# Patient Record
Sex: Female | Born: 1985 | Race: Black or African American | Hispanic: No | Marital: Single | Smoking: Never smoker
Health system: Southern US, Community
[De-identification: ages and names within clinical notes are randomized; demographics above are authoritative.]

## PROBLEM LIST (undated history)

## (undated) DIAGNOSIS — J45909 Unspecified asthma, uncomplicated: Secondary | ICD-10-CM

## (undated) HISTORY — PX: WISDOM TOOTH EXTRACTION: SHX21

## (undated) HISTORY — PX: TONSILLECTOMY: SUR1361

---

## 2012-06-30 ENCOUNTER — Emergency Department (HOSPITAL_BASED_OUTPATIENT_CLINIC_OR_DEPARTMENT_OTHER)
Admission: EM | Admit: 2012-06-30 | Discharge: 2012-06-30 | Disposition: A | Discharge status: Home / Self Care | Payer: Medicaid - Out of State | Attending: Emergency Medicine | Admitting: Emergency Medicine

## 2012-06-30 ENCOUNTER — Encounter (HOSPITAL_BASED_OUTPATIENT_CLINIC_OR_DEPARTMENT_OTHER): Payer: Self-pay | Admitting: *Deleted

## 2012-06-30 ENCOUNTER — Emergency Department (HOSPITAL_BASED_OUTPATIENT_CLINIC_OR_DEPARTMENT_OTHER): Payer: Medicaid - Out of State

## 2012-06-30 DIAGNOSIS — J3489 Other specified disorders of nose and nasal sinuses: Secondary | ICD-10-CM | POA: Insufficient documentation

## 2012-06-30 DIAGNOSIS — J189 Pneumonia, unspecified organism: Secondary | ICD-10-CM

## 2012-06-30 DIAGNOSIS — R509 Fever, unspecified: Secondary | ICD-10-CM | POA: Insufficient documentation

## 2012-06-30 DIAGNOSIS — R0789 Other chest pain: Secondary | ICD-10-CM | POA: Insufficient documentation

## 2012-06-30 MED ORDER — ACETAMINOPHEN 325 MG PO TABS
650.0000 mg | ORAL_TABLET | Freq: Once | ORAL | Status: AC
  Administered 2012-06-30: 650 mg via ORAL
  Filled 2012-06-30: qty 2

## 2012-06-30 MED ORDER — AZITHROMYCIN 250 MG PO TABS
500.0000 mg | ORAL_TABLET | Freq: Once | ORAL | Status: AC
  Administered 2012-06-30: 500 mg via ORAL
  Filled 2012-06-30: qty 2

## 2012-06-30 MED ORDER — AZITHROMYCIN 250 MG PO TABS: 250.0000 mg | ORAL_TABLET | Freq: Every day | ORAL | Status: DC

## 2012-06-30 NOTE — ED Notes (Signed)
Productive cough with yellow sputum, sore throat, and soreness in her chest. After taking Tussin she got sob and had chest tightness.

## 2012-06-30 NOTE — ED Notes (Signed)
Pt reports that she has had upper airway infection, headache, earache for several weeks, yesterday she bought robutussin dm, immediately felt tight sensation in chest and reports burning feeling, pt called insurance nurse and reported it but did not come to er at that time,  Pt came to ed tonight because she states she can not stop coughing

## 2012-06-30 NOTE — ED Provider Notes (Signed)
History     CSN: [id number]  Arrival date & time 06/30/12  1906   First MD Initiated Contact with Patient 06/30/12 2041      Chief Complaint  Patient presents with  . Cough    (Consider location/radiation/quality/duration/timing/severity/associated sxs/prior treatment) HPI Pt presents with c/o nasal congestion and cough over the past 1 week.  Cough became worse yesterday.  Low grade fever- temp 100 here in ED.  No sob or vomiting.  No chest pain, but some pain with frequent coughing.  Took OTC cough medicine today and felt a burning in her chest afterwards.  She has no specific sick contacts.  Did not receive flu shot this year.  Cough is productive.  No abdominal pain. Has continued to drink liquids normally.  There are no other associated systemic symptoms, there are no other alleviating or modifying factors.   History reviewed. No pertinent past medical history.  Past Surgical History  Procedure Date  . Tonsillectomy     No family history on file.  History  Substance Use Topics  . Smoking status: Never Smoker   . Smokeless tobacco: Not on file  . Alcohol Use: No    OB History    Grav Para Term Preterm Abortions TAB SAB Ect Mult Living                  Review of Systems ROS reviewed and all otherwise negative except for mentioned in HPI  Allergies  Ivp dye  Home Medications   Current Outpatient Rx  Name  Route  Sig  Dispense  Refill  . AZITHROMYCIN 250 MG PO TABS   Oral   Take 1 tablet (250 mg total) by mouth daily.   4 tablet   0     BP 123/76  Pulse 101  Temp 100 F (37.8 C) (Oral)  Resp 22  SpO2 98% Vitals reviewed Physical Exam Physical Examination: General appearance - alert, well appearing, and in no distress Mental status - alert, oriented to person, place, and time Eyes - no conjunctival injection, no scleral icterus Mouth - mucous membranes moist, pharynx normal without lesions Neck - supple, no significant adenopathy Chest - clear to  auscultation, no wheezes, rales or rhonchi, symmetric air entry, no increased respiratory effort, frequent coughing Heart - normal rate, regular rhythm, normal S1, S2, no murmurs, rubs, clicks or gallops Abdomen - soft, nontender, nondistended, no masses or organomegaly Extremities - peripheral pulses normal, no pedal edema, no clubbing or cyanosis Skin - normal coloration and turgor, no rashes  ED Course  Procedures (including critical care time)   Date: 06/30/2012  Rate: 98  Rhythm: normal sinus rhythm  QRS Axis: normal  Intervals: normal  ST/T Wave abnormalities: normal  Conduction Disutrbances:none  Narrative Interpretation:   Old EKG Reviewed: none available   Labs Reviewed - No data to display Dg Chest 2 View  06/30/2012  *RADIOLOGY REPORT*  Clinical Data: Cough.  Short of breath.  CHEST - 2 VIEW  Comparison: None.  Findings: Patchy right lower lobe airspace opacities present suspicious for pneumonia.  No effusion.  The left lung is clear. Cardiopericardial silhouette within normal limits. Follow-up to ensure radiographic clearing recommended.  Clearing is usually observed at 8 weeks.  IMPRESSION: Right lower lobe pneumonia.   Original Report Authenticated By: Andreas Newport, M.D.      1. Community acquired pneumonia       MDM  Pt presenting with cough and nasal congestion with cough- CXR shows RLL  pneumonia- xray images reviewed by me as well.  Pt started on zithromax.  First dose given in ED.  Discharged with strict return precautions.  Pt agreeable with plan.        Ethelda Chick, MD 06/30/12 [phone]

## 2012-10-01 ENCOUNTER — Encounter (HOSPITAL_COMMUNITY): Payer: Self-pay | Admitting: Family Medicine

## 2012-10-01 ENCOUNTER — Emergency Department (HOSPITAL_COMMUNITY)
Admission: EM | Admit: 2012-10-01 | Discharge: 2012-10-01 | Disposition: A | Discharge status: Home / Self Care | Payer: Medicaid Other | Attending: Emergency Medicine | Admitting: Emergency Medicine

## 2012-10-01 DIAGNOSIS — K047 Periapical abscess without sinus: Secondary | ICD-10-CM | POA: Insufficient documentation

## 2012-10-01 DIAGNOSIS — K089 Disorder of teeth and supporting structures, unspecified: Secondary | ICD-10-CM | POA: Insufficient documentation

## 2012-10-01 DIAGNOSIS — K0889 Other specified disorders of teeth and supporting structures: Secondary | ICD-10-CM

## 2012-10-01 MED ORDER — PENICILLIN V POTASSIUM 500 MG PO TABS: 500.0000 mg | ORAL_TABLET | Freq: Three times a day (TID) | ORAL | Status: DC

## 2012-10-01 MED ORDER — BUPIVACAINE-EPINEPHRINE PF 0.5-1:200000 % IJ SOLN
1.8000 mL | Freq: Once | INTRAMUSCULAR | Status: AC
  Administered 2012-10-01: 9 mg
  Filled 2012-10-01: qty 1.8

## 2012-10-01 MED ORDER — HYDROCODONE-ACETAMINOPHEN 5-325 MG PO TABS: 1.0000 | ORAL_TABLET | ORAL | Status: DC | PRN

## 2012-10-01 NOTE — ED Notes (Signed)
Patient states she has had a toothache since 630pm. States she has taken Tylenol and Tylenol with Codeine without relief of symptoms. Has not seen a doctor.

## 2012-10-01 NOTE — ED Provider Notes (Signed)
History     CSN: [id number]  Arrival date & time 10/01/12  0019   First MD Initiated Contact with Patient 10/01/12 0130      Chief Complaint  Patient presents with  . Dental Pain   HPI  History provided by the patient. Patient is a 27 year old female with no significant PMH who presents with complaints of worsening right upper dental pain. Patient reports first having some dental discomfort and pains to the right upper teeth earlier in the afternoon and evening. Pain began to get worse around 6:30 PM. Patient began using Tylenol 3 with codeine to try to help with symptoms without any significant relief. Pain is now too severe she is unable to sleep. Patient is recently relocated to this area and does not have a primary care doctor or dentist. Denies any other associated symptoms. No fever, chills or sweats. No swelling around the mouth or tongue. No difficulty breathing or swallowing.    History reviewed. No pertinent past medical history.  Past Surgical History  Procedure Laterality Date  . Tonsillectomy      No family history on file.  History  Substance Use Topics  . Smoking status: Never Smoker   . Smokeless tobacco: Not on file  . Alcohol Use: No    OB History   Grav Para Term Preterm Abortions TAB SAB Ect Mult Living                  Review of Systems  Constitutional: Negative for fever and chills.  All other systems reviewed and are negative.    Allergies  Ivp dye  Home Medications   Current Outpatient Rx  Name  Route  Sig  Dispense  Refill  . acetaminophen (TYLENOL) 500 MG tablet   Oral   Take 1,000-1,500 mg by mouth every 6 (six) hours as needed. For tooth pain         . acetaminophen-codeine (TYLENOL #3) 300-30 MG per tablet   Oral   Take 0.5-1 tablets by mouth every 4 (four) hours as needed. For tooth pain           BP 133/74  Pulse 70  Temp(Src) 98.1 F (36.7 C) (Oral)  Resp 20  SpO2 100%  LMP 09/16/2012  Physical Exam  Nursing  note and vitals reviewed. Constitutional: She is oriented to person, place, and time. She appears well-developed and well-nourished. No distress.  HENT:  Head: Normocephalic.  Mouth/Throat:    Tenderness to percussion to the right upper second premolar and first molar. No significant swelling to the adjacent gum sore mouth. No facial swelling.  Neck: Normal range of motion. Neck supple.  Cardiovascular: Normal rate and regular rhythm.   Pulmonary/Chest: Effort normal and breath sounds normal.  Musculoskeletal: Normal range of motion.  Lymphadenopathy:    She has no cervical adenopathy.  Neurological: She is alert and oriented to person, place, and time.  Skin: Skin is warm and dry. No rash noted.  Psychiatric: She has a normal mood and affect. Her behavior is normal.    ED Course  Procedures   Dental Block Performed by: Angus Seller Authorized by: Angus Seller Consent: Verbal consent obtained. Risks and benefits: risks, benefits and alternatives were discussed Consent given by: patient Patient identity confirmed: provided demographic data  Location: Right upper second premolar  Local anesthetic: Bupivacaine 0.5% with epinephrine  Anesthetic total: 1.8 ml  Irrigation method: syringe  Patient tolerance: Patient tolerated the procedure well with no immediate complications. Pain improved.  1. Pain, dental   2. Periapical abscess       MDM  1:40 AM patient seen and evaluated. Patient does not appear she discomfort or any acute distress.     Angus Seller, PA-C 10/01/12 [phone]

## 2012-10-01 NOTE — ED Provider Notes (Signed)
Medical screening examination/treatment/procedure(s) were performed by non-physician practitioner and as supervising physician I was immediately available for consultation/collaboration.  John-Adam Naylee Frankowski, M.D.     John-Adam Jema Deegan, MD 10/01/12 0508 

## 2013-07-19 ENCOUNTER — Encounter (HOSPITAL_COMMUNITY): Payer: Self-pay | Admitting: Emergency Medicine

## 2013-07-19 ENCOUNTER — Emergency Department (HOSPITAL_COMMUNITY)
Admission: EM | Admit: 2013-07-19 | Discharge: 2013-07-20 | Disposition: A | Discharge status: Home / Self Care | Payer: Medicaid Other | Attending: Emergency Medicine | Admitting: Emergency Medicine

## 2013-07-19 DIAGNOSIS — M549 Dorsalgia, unspecified: Secondary | ICD-10-CM | POA: Insufficient documentation

## 2013-07-19 DIAGNOSIS — J029 Acute pharyngitis, unspecified: Secondary | ICD-10-CM | POA: Insufficient documentation

## 2013-07-19 DIAGNOSIS — N390 Urinary tract infection, site not specified: Secondary | ICD-10-CM | POA: Insufficient documentation

## 2013-07-19 DIAGNOSIS — B349 Viral infection, unspecified: Secondary | ICD-10-CM

## 2013-07-19 DIAGNOSIS — N898 Other specified noninflammatory disorders of vagina: Secondary | ICD-10-CM | POA: Insufficient documentation

## 2013-07-19 DIAGNOSIS — B9789 Other viral agents as the cause of diseases classified elsewhere: Secondary | ICD-10-CM | POA: Insufficient documentation

## 2013-07-19 DIAGNOSIS — Z791 Long term (current) use of non-steroidal anti-inflammatories (NSAID): Secondary | ICD-10-CM | POA: Insufficient documentation

## 2013-07-19 DIAGNOSIS — Z79899 Other long term (current) drug therapy: Secondary | ICD-10-CM | POA: Insufficient documentation

## 2013-07-19 DIAGNOSIS — Z3202 Encounter for pregnancy test, result negative: Secondary | ICD-10-CM | POA: Insufficient documentation

## 2013-07-19 DIAGNOSIS — Z792 Long term (current) use of antibiotics: Secondary | ICD-10-CM | POA: Insufficient documentation

## 2013-07-19 LAB — LIPASE, BLOOD: Lipase: 41 U/L (ref 11–59)

## 2013-07-19 LAB — COMPREHENSIVE METABOLIC PANEL
ALBUMIN: 3.6 g/dL (ref 3.5–5.2)
ALT: 15 U/L (ref 0–35)
AST: 19 U/L (ref 0–37)
Alkaline Phosphatase: 109 U/L (ref 39–117)
BUN: 9 mg/dL (ref 6–23)
CALCIUM: 8.6 mg/dL (ref 8.4–10.5)
CO2: 24 meq/L (ref 19–32)
CREATININE: 0.76 mg/dL (ref 0.50–1.10)
Chloride: 100 mEq/L (ref 96–112)
GFR calc Af Amer: >90 mL/min (ref >90)
GFR calc non Af Amer: >90 mL/min (ref >90)
Glucose, Bld: 91 mg/dL (ref 70–99)
Potassium: 4.1 mEq/L (ref 3.7–5.3)
SODIUM: 137 meq/L (ref 137–147)
Total Bilirubin: 0.3 mg/dL (ref 0.3–1.2)
Total Protein: 7.6 g/dL (ref 6.0–8.3)

## 2013-07-19 LAB — CBC WITH DIFFERENTIAL/PLATELET
BASOS ABS: 0 10*3/uL (ref 0.0–0.1)
BASOS PCT: 0 % (ref 0–1)
EOS PCT: 1 % (ref 0–5)
Eosinophils Absolute: 0.1 10*3/uL (ref 0.0–0.7)
HEMATOCRIT: 39.5 % (ref 36.0–46.0)
Hemoglobin: 13.2 g/dL (ref 12.0–15.0)
LYMPHS PCT: 8 % — AB (ref 12–46)
Lymphs Abs: 1.2 10*3/uL (ref 0.7–4.0)
MCH: 27.8 pg (ref 26.0–34.0)
MCHC: 33.4 g/dL (ref 30.0–36.0)
MCV: 83.3 fL (ref 78.0–100.0)
MONO ABS: 0.7 10*3/uL (ref 0.1–1.0)
Monocytes Relative: 5 % (ref 3–12)
NEUTROS ABS: 12.3 10*3/uL — AB (ref 1.7–7.7)
Neutrophils Relative %: 86 % — ABNORMAL HIGH (ref 43–77)
PLATELETS: 278 10*3/uL (ref 150–400)
RBC: 4.74 MIL/uL (ref 3.87–5.11)
RDW: 14 % (ref 11.5–15.5)
WBC: 14.4 10*3/uL — AB (ref 4.0–10.5)

## 2013-07-19 LAB — URINE MICROSCOPIC-ADD ON

## 2013-07-19 LAB — URINALYSIS, ROUTINE W REFLEX MICROSCOPIC
Bilirubin Urine: NEGATIVE
GLUCOSE, UA: NEGATIVE mg/dL
HGB URINE DIPSTICK: NEGATIVE
Ketones, ur: NEGATIVE mg/dL
Nitrite: NEGATIVE
PH: 7.5 (ref 5.0–8.0)
PROTEIN: NEGATIVE mg/dL
SPECIFIC GRAVITY, URINE: 1.012 (ref 1.005–1.030)
Urobilinogen, UA: 1 mg/dL (ref 0.0–1.0)

## 2013-07-19 NOTE — ED Notes (Signed)
Pt reports that she has had a sore throat since yesterday, treated with Ibuprofen and Thera-flu, stated that today she began having abdominal cramping, denies n/d. Pt a&o x4, NAD noted at this time.

## 2013-07-20 ENCOUNTER — Emergency Department (HOSPITAL_COMMUNITY): Payer: Medicaid Other

## 2013-07-20 ENCOUNTER — Encounter (HOSPITAL_COMMUNITY): Payer: Self-pay

## 2013-07-20 LAB — WET PREP, GENITAL
Clue Cells Wet Prep HPF POC: NONE SEEN
TRICH WET PREP: NONE SEEN
YEAST WET PREP: NONE SEEN

## 2013-07-20 LAB — POCT PREGNANCY, URINE: PREG TEST UR: NEGATIVE

## 2013-07-20 LAB — URINE CULTURE
Colony Count: NO GROWTH
Culture: NO GROWTH

## 2013-07-20 LAB — TROPONIN I

## 2013-07-20 LAB — GC/CHLAMYDIA PROBE AMP
CT Probe RNA: NEGATIVE
GC Probe RNA: NEGATIVE

## 2013-07-20 MED ORDER — SODIUM CHLORIDE 0.9 % IV BOLUS (SEPSIS)
1000.0000 mL | Freq: Once | INTRAVENOUS | Status: AC
  Administered 2013-07-20: 1000 mL via INTRAVENOUS

## 2013-07-20 MED ORDER — SULFAMETHOXAZOLE-TRIMETHOPRIM 800-160 MG PO TABS: 1.0000 | ORAL_TABLET | Freq: Two times a day (BID) | ORAL | Status: AC

## 2013-07-20 MED ORDER — ONDANSETRON HCL 4 MG PO TABS: 4.0000 mg | ORAL_TABLET | Freq: Four times a day (QID) | ORAL | Status: DC

## 2013-07-20 MED ORDER — DEXTROSE 5 % IV SOLN
1.0000 g | Freq: Once | INTRAVENOUS | Status: AC
  Administered 2013-07-20: 1 g via INTRAVENOUS
  Filled 2013-07-20: qty 10

## 2013-07-20 MED ORDER — KETOROLAC TROMETHAMINE 30 MG/ML IJ SOLN
30.0000 mg | Freq: Once | INTRAMUSCULAR | Status: AC
  Administered 2013-07-20: 30 mg via INTRAVENOUS
  Filled 2013-07-20 (×2): qty 1

## 2013-07-20 MED ORDER — NAPROXEN 375 MG PO TABS: 375.0000 mg | ORAL_TABLET | Freq: Two times a day (BID) | ORAL | Status: DC

## 2013-07-20 MED ORDER — METOCLOPRAMIDE HCL 5 MG/ML IJ SOLN
10.0000 mg | Freq: Once | INTRAMUSCULAR | Status: AC
  Administered 2013-07-20: 10 mg via INTRAVENOUS
  Filled 2013-07-20: qty 2

## 2013-07-20 NOTE — Discharge Instructions (Signed)
Please call your doctor for a followup appointment within 24-48 hours. When you talk to your doctor please let them know that you were seen in the emergency department and have them acquire all of your records so that they can discuss the findings with you and formulate a treatment plan to fully care for your new and ongoing problems. Please rest and stay hydrated Please continue to drink plenty of water and cranberry juice to aid in relief of urinary tract infection  Please take antibiotics as prescribed Please follow-up with OBGYN and ear, nose, and throat physician to be re-assessed Please continue to monitor symptoms and if symptoms are to worsen or change (fever greater than 101, chills, sweating, nausea, vomiting, stomach pain, worsening back pain, chest pain, shortness of breath, difficulty breathing, neck pain, neck stiffness, inability to swallow, ear pain, worst headache of life, visual changes, loss of vision) please report back to the ED immediately  Urinary Tract Infection Urinary tract infections (UTIs) can develop anywhere along your urinary tract. Your urinary tract is your body's drainage system for removing wastes and extra water. Your urinary tract includes two kidneys, two ureters, a bladder, and a urethra. Your kidneys are a pair of bean-shaped organs. Each kidney is about the size of your fist. They are located below your ribs, one on each side of your spine. CAUSES Infections are caused by microbes, which are microscopic organisms, including fungi, viruses, and bacteria. These organisms are so small that they can only be seen through a microscope. Bacteria are the microbes that most commonly cause UTIs. SYMPTOMS  Symptoms of UTIs may vary by age and gender of the patient and by the location of the infection. Symptoms in young women typically include a frequent and intense urge to urinate and a painful, burning feeling in the bladder or urethra during urination. Older women and men  are more likely to be tired, shaky, and weak and have muscle aches and abdominal pain. A fever may mean the infection is in your kidneys. Other symptoms of a kidney infection include pain in your back or sides below the ribs, nausea, and vomiting. DIAGNOSIS To diagnose a UTI, your caregiver will ask you about your symptoms. Your caregiver also will ask to provide a urine sample. The urine sample will be tested for bacteria and white blood cells. White blood cells are made by your body to help fight infection. TREATMENT  Typically, UTIs can be treated with medication. Because most UTIs are caused by a bacterial infection, they usually can be treated with the use of antibiotics. The choice of antibiotic and length of treatment depend on your symptoms and the type of bacteria causing your infection. HOME CARE INSTRUCTIONS  If you were prescribed antibiotics, take them exactly as your caregiver instructs you. Finish the medication even if you feel better after you have only taken some of the medication.  Drink enough water and fluids to keep your urine clear or pale yellow.  Avoid caffeine, tea, and carbonated beverages. They tend to irritate your bladder.  Empty your bladder often. Avoid holding urine for long periods of time.  Empty your bladder before and after sexual intercourse.  After a bowel movement, women should cleanse from front to back. Use each tissue only once. SEEK MEDICAL CARE IF:   You have back pain.  You develop a fever.  Your symptoms do not begin to resolve within 3 days. SEEK IMMEDIATE MEDICAL CARE IF:   You have severe back pain or  lower abdominal pain.  You develop chills.  You have nausea or vomiting.  You have continued burning or discomfort with urination. MAKE SURE YOU:   Understand these instructions.  Will watch your condition.  Will get help right away if you are not doing well or get worse. Document Released: 03/20/2005 Document Revised: 12/10/2011  Document Reviewed: 07/19/2011 [facility] Patient Information 2014 [place], [place].  Viral Infections A virus is a type of germ. Viruses can cause: Minor sore throats. Aches and pains. Headaches. Runny nose. Rashes. Watery eyes. Tiredness. Coughs. Loss of appetite. Feeling sick to your stomach (nausea). Throwing up (vomiting). Watery poop (diarrhea). HOME CARE  Only take medicines as told by your doctor. Drink enough water and fluids to keep your pee (urine) clear or pale yellow. Sports drinks are a good choice. Get plenty of rest and eat healthy. Soups and broths with crackers or rice are fine. GET HELP RIGHT AWAY IF:  You have a very bad headache. You have shortness of breath. You have chest pain or neck pain. You have an unusual rash. You cannot stop throwing up. You have watery poop that does not stop. You cannot keep fluids down. You or your child has a temperature by mouth above 102 F (38.9 C), not controlled by medicine. Your baby is older than 3 months with a rectal temperature of 102 F (38.9 C) or higher. Your baby is 52 months old or younger with a rectal temperature of 100.4 F (38 C) or higher. MAKE SURE YOU:  Understand these instructions. Will watch this condition. Will get help right away if you are not doing well or get worse. Document Released: 05/23/2008 Document Revised: 09/02/2011 Document Reviewed: 10/16/2010 [facility] Patient Information 2014 [place], [place].    Emergency Department Resource Guide 1) Find a Doctor and Pay Out of Pocket Although you won't have to find out who is covered by your insurance plan, it is a good idea to ask around and get recommendations. You will then need to call the office and see if the doctor you have chosen will accept you as a new patient and what types of options they offer for patients who are self-pay. Some doctors offer discounts or will set up payment plans for their patients who do not have insurance, but you  will need to ask so you aren't surprised when you get to your appointment.  2) Contact Your Local Health Department Not all health departments have doctors that can see patients for sick visits, but many do, so it is worth a call to see if yours does. If you don't know where your local health department is, you can check in your phone book. The CDC also has a tool to help you locate your state's health department, and many state websites also have listings of all of their local health departments.  3) Find a Walk-in Clinic If your illness is not likely to be very severe or complicated, you may want to try a walk in clinic. These are popping up all over the country in pharmacies, drugstores, and shopping centers. They're usually staffed by nurse practitioners or physician assistants that have been trained to treat common illnesses and complaints. They're usually fairly quick and inexpensive. However, if you have serious medical issues or chronic medical problems, these are probably not your best option.  No Primary Care Doctor: - Call Health Connect at  [phone] - they can help you locate a primary care doctor that  accepts your insurance, provides certain  services, etc. - Physician Referral Service- [phone]  Chronic Pain Problems: Organization         Address  Phone   Notes  [facility]  [phone] Patients need to be referred by their primary care doctor.   Medication Assistance: Organization         Address  Phone   Notes  [facility] Medication [facility] [address] [place], [place] [address] [phone] --Must be a resident of [facility] -- Must have NO insurance coverage whatsoever (no Medicaid/ Medicare, etc.) -- The pt. MUST have a primary care doctor that directs their care regularly and follows them in the community   [facility]  [phone]   [employer]  [phone]    Agencies that provide inexpensive medical  care: Organization         Address  Phone   Notes  [facility]  [phone]   [facility]    [phone]   [facility] [address] [place], [place] [address] [phone]   [facility] [address] [place] [place] [phone]   Planned Parenthood    [phone]   [facility]    [phone]   Community [facility] and [facility]  [address], [place] Phone:  [phone], Fax:  [phone] Hours of Operation:  9 am - 6 pm, M-F.  Also accepts Medicaid/Medicare and self-pay.  [facility]  [address], [place] Phone: [phone], Fax: [phone]. Hours of Operation:  8:30 am - 5:30 pm, M-F.  Also accepts Medicaid and self-pay.  [facility] [place] [address] [place] Phone: [phone]   [facility] [address] Natasha Bence [place], [place] [phone], Ext. [phone] Mondays & Thursdays: 7-9 AM.  First 15 patients are seen on a first come, first serve basis.    Medicaid-accepting [facility] Providers:  Organization         Address  Phone   Notes  [facility] [address], [place] [phone] Also accepts self-pay patients.  [facility] [address] Laurell Josephs [place], [place]  [phone]   [facility] [address], [place] [phone]   [facility] [address] [place] [phone]   Renaye Rakers [address], [place]   [phone] Only accepts [place] Access [place] patients after they have their name applied to their card.   Self-Pay (no insurance) in [facility]:  Organization         Address  Phone   Notes  Sickle Cell Patients, [facility] Internal Medicine [address] [place], [place] [phone]   [facility] Care [address] [place],  [place] [phone]   [facility] [place]  [address], [place] [phone]   [facility]/Dr. Osei-Bonsu  [address] [place] or [address], [place] [phone] Phone number for both [place] and [place] locations is the same.  [facility]gent Medical and [facility] [address], [place] [phone]   [facility] [address] [place] or [address] [phone]   [facility] [address],  [place] [phone]) [phone], phone; [phone]) [phone], fax Sees patients 1st and 3rd Saturday of every month.  Must not qualify for public or private insurance (i.e. Medicaid, Medicare, [place] Health Choice, Veterans' Benefits)  Household income should be no more than 200% of the poverty level The clinic cannot treat you if you are pregnant or think you are pregnant  Sexually transmitted diseases are not treated at the clinic.    Dental Care: Organization         Address  Phone  Notes  [facility] [address], [place] [phone]) [phone] Accepts children up to age 29 who are enrolled in [place] or [place] Health Choice; pregnant women with a Medicaid card; and children who have applied for Medicaid or [place] Health Choice, but were declined, whose parents can pay a reduced fee at time of service.  [facility] [place]  [address], [place] [phone]) [phone] Accepts children up to age 29 who are enrolled in [place] or [place] Health Choice; pregnant women with a Medicaid card; and children who have applied for Medicaid or [place] Health Choice, but were declined, whose parents can pay a reduced fee at time of service.  [facility]  [address], [place] [phone] Patients are seen by appointment only. Walk-ins are not accepted. [facility] will see patients 29 years of age and older. Monday - Tuesday (8am-5pm) Most Wednesdays (8:30-5pm) $30 per visit, cash only  [facility]  [address], [facility] [place] [phone] Patients are seen by appointment only. Walk-ins are not accepted. [facility] will see patients 29 years of age and older. One Wednesday Evening (Monthly: Volunteer Based).  $30 per visit, cash only  [facility]  [phone]) [phone] for adults; Children under age 194, call Graduate Pediatric Dentistry at [phone]) [phone]. Children aged 244-14, please call [phone]) [phone] to request a pediatric application.  Dental services are provided in all areas of dental care including fillings, crowns and bridges, complete and partial dentures, implants, gum treatment, root canals, and extractions. Preventive care is also provided. Treatment is provided to both adults and children. Patients are selected via a lottery and there is often a waiting list.   [facility] [address], [place]  [phone]) [phone] [email or web address]   [facility] [address], [place], [place] [phone], Ext. [phone] Second and Fourth Thursday of each month, opens at 6:30 AM; Clinic ends at 9 AM.  Patients are seen on a first-come first-served basis, and a limited number are seen during each clinic.   [facility]  [address], [place], [place] [phone]) [phone]   Eligibility Requirements You must have lived in [place], [place], or [place] for at least the last three months.   You cannot be eligible for state or federal sponsored National Cityhealthcare insurance, including CIGNAVeterans Administration, [place]icaid, or [employer]icare.   You generally cannot be eligible for healthcare insurance through your employer.    How to apply: Eligibility screenings are held every Tuesday and Wednesday afternoon from 1:00 pm until 4:00 pm. You do not need an appointment for the interview!   [facility] [address], [place], [place] [phone]   [facility]  [phone]   [facility]  [phone]   [facility]  [phone]    Behavioral  Health Resources in the Community: Intensive Outpatient Programs Organization         Address  Phone  Notes  [facility] [address], [place], [place] [phone]   [facility] Outpatient [address], [place], [place] [phone]   [facility] [address] [place], [place]  [phone]   [facility] [address]  [place], [place] [phone] or [phone]   Substance Abuse Resources Organization         Address  Phone  Notes  [facility] Drug [facility]  [phone]   [facility]  [phone]   [facility] [place]  [phone]   Floydene Flock  [phone]   Residential & Outpatient Substance Abuse Program  5808093721   Psychological Services Organization         Address  Phone  Notes  [facility]  [phone]   [facility]  [phone]   [facility] [address], [place] [phone] or [phone]    Mobile Crisis Teams Organization         Address  Phone  Notes  [facility], Mobile Crisis Care Unit  [phone]) [phone]   [facility]  [address] [place], [place] [phone]   Doristine Locks [address] [place] [phone]    Self-Help/Support Groups Organization         Address  Phone             Notes  [facility] [place] - variety of support groups  336- I7437963 Call for more information  Narcotics Anonymous (NA), [facility]aring [facility] [address], [employer] [place]  2 meetings at this location   Statistician         Address  Phone  Notes  [facility] [address],    [place]  [phone]   [facility]  [address] [place] [address], [place], [place] [phone]   [facility] [address] [place], [place] [phone] Admissions: 8am-3pm M-F  [facility] [address]    [place], [place] [phone]   [facility] [address] [place], [place], [place] [phone]   [facility] [address]  [place], [place] [phone]   [facility] - Intensive Outpatient [address] Laure[address] Josephs [address], [place], [place] [phone]   [facility] ([facility] [address]  [place], [place] [phone] or [phone]   [facility] ([facility]) [address], [place], [place] [phone] Accepts Medicaid  [facility] [place] [address]  [place] [phone] Substance Abuse/Addiction Treatment   [facility] Organization         Address  Phone  Notes  [facility]  [phone]   Angie Fava, PhD [address] Ervin Knack [place], [place]   3321036377 or [phone]   [facility]   [address] [place], [place] [phone]   [facility] [address] [place], [place] [phone] Insurance/Medicaid/sponsorship through [employer] and Families [address]                                    [place], [place] [phone] Therapy/tele-psych/case  [facility] [address]   [place], [place] (  336) [phone]    Dr. Lolly MustacheArfeen  [phone]) [phone]   [facility]  [employer] [facility] 1) [address], [place] 2) [address], [place] 3)  [address], [place] [phone]) [phone]  [phone](336) [phone]   [facility] Child Abuse Hotline 8017339453(336) [phone] or [phone](336) [phone] (After Hours)

## 2013-07-20 NOTE — ED Provider Notes (Signed)
CSN: [id number]     Arrival date & time 07/19/13  1851 History   First MD Initiated Contact with Patient 07/20/13 0009     Chief Complaint  Patient presents with  . Sore Throat  . Abdominal Pain   (Consider location/radiation/quality/duration/timing/severity/associated sxs/prior Treatment) The history is provided by the patient. No language interpreter was used.  Melinda Dorsey is a 28 y/o F with no significant PMHx presenting to the ED with multiple complaints. Patient reported that she started to develop a sore throat, back cramps, migraine that started today. Patient reported that the sore throat is worse with food and liquids - stated that she had her tonsils removed. Stated that she has been having lower abdominal pain described as an aching sensation with associated back pain described as a dull, aching sensation localized to the lower portion of the back. Patient reported that the back pain has gotten progressively worse when waiting in the waiting room in the ED. Patient reported that she has also developed a migraine - reported throbbing pain localized to bilateral aspects of the head. Patient reported that she has been experiencing photophobia and phonophobia. Patient reported that the headache came on gradually - stated that she gets migraines normally and stated that she normally presents in this manner. Patient stated that she has been having a mild cough mainly of whitish phlegm. Stated that she started to experience chest pain in the ED waiting room localized to the center of her chest described as a sharp pain with radiation to the right side of her chest - stated that the chest pain occurred with cough. Denied shortness of breath, difficulty breathing, dysuria, hematuria, neck pain, neck stiffness, nasal congestion, visual distortions, thunderclap headache, worst headache of life.  PCP none   History reviewed. No pertinent past medical history. Past Surgical History  Procedure  Laterality Date  . Tonsillectomy    . Wisdom tooth extraction     History reviewed. No pertinent family history. History  Substance Use Topics  . Smoking status: Never Smoker   . Smokeless tobacco: Never Used  . Alcohol Use: No   OB History   Grav Para Term Preterm Abortions TAB SAB Ect Mult Living                 Review of Systems  Constitutional: Negative for fever and chills.  HENT: Positive for sore throat and trouble swallowing.   Respiratory: Negative for chest tightness.   Gastrointestinal: Positive for abdominal pain. Negative for nausea and vomiting.  Musculoskeletal: Positive for back pain.  All other systems reviewed and are negative.    Allergies  Ivp dye  Home Medications   Current Outpatient Rx  Name  Route  Sig  Dispense  Refill  . ibuprofen (ADVIL,MOTRIN) 200 MG tablet   Oral   Take 400 mg by mouth every 6 (six) hours as needed for headache.         . Phenylephrine-Pheniramine-DM (THERAFLU COLD & COUGH) 04-13-19 MG PACK   Oral   Take 1 packet by mouth daily.         . naproxen (NAPROSYN) 375 MG tablet   Oral   Take 1 tablet (375 mg total) by mouth 2 (two) times daily.   20 tablet   0   . ondansetron (ZOFRAN) 4 MG tablet   Oral   Take 1 tablet (4 mg total) by mouth every 6 (six) hours.   12 tablet   0   . sulfamethoxazole-trimethoprim (BACTRIM DS,SEPTRA  DS) 800-160 MG per tablet   Oral   Take 1 tablet by mouth 2 (two) times daily.   14 tablet   0    BP 128/65  Pulse 98  Temp(Src) 99.1 F (37.3 C) (Oral)  Resp 18  Ht 5\' 6"  (1.676 m)  Wt 272 lb (123.378 kg)  BMI 43.92 kg/m2  SpO2 100%  LMP 05/19/2013 Physical Exam  Nursing note and vitals reviewed. Constitutional: She is oriented to person, place, and time. She appears well-developed and well-nourished. No distress.  HENT:  Head: Normocephalic and atraumatic.  Mouth/Throat: Oropharynx is clear and moist. No oropharyngeal exudate.  Mild erythema noted to the posterior  oropharynx. Negative findings the tonsils-tonsillectomy performed. Negative petechiae or exudate noted to the posterior oropharynx. Uvula midline, symmetrical elevation. Negative uvula swelling.  Eyes: Conjunctivae and EOM are normal. Pupils are equal, round, and reactive to light. Right eye exhibits no discharge. Left eye exhibits no discharge.  Negative nystagmus  Neck: Normal range of motion. Neck supple. No tracheal deviation present.    Discomfort upon palpation to the left side of the neck. Negative swelling or inflammation noted.   Cardiovascular: Normal rate, regular rhythm and normal heart sounds.  Exam reveals no friction rub.   No murmur heard. Pulmonary/Chest: Effort normal and breath sounds normal. No respiratory distress. She has no wheezes. She has no rales. She exhibits tenderness.  Discomfort upon palpation to the Center the chest and to the right-sided chest upon palpation Negative ecchymosis Negative crepitus upon palpation  Abdominal: Soft. Bowel sounds are normal. There is tenderness in the right lower quadrant, suprapubic area and left lower quadrant. There is CVA tenderness (left sided). There is no guarding, no tenderness at McBurney's point and negative Murphy's sign.    Obese Discomfort upon palpation to the epigastric region  Genitourinary: Vaginal discharge found.  Pelvic Exam: Negative swelling, erythema, inflammation, lesions, sores noted to the external genitalia. Negative swelling, erythema, inflammation, lesions, sores, fissures noted to the vaginal canal. Positive thick white discharge noted. Mild discomfort upon palpation to the suprapubic region. Negative CMT. Negative bilateral adnexal tenderness.   Musculoskeletal: Normal range of motion.  Full ROM to upper and lower extremities without difficulty noted, negative ataxia noted.  Neurological: She is alert and oriented to person, place, and time. No cranial nerve deficit. She exhibits normal muscle tone.  Coordination normal.  Cranial nerves III-XII grossly intact Strength 5+/5+ to upper and lower extremities bilaterally with resistance applied, equal distribution noted Fine motor skills intact Heel to knee down shin normal bilaterally Gait proper, proper balance - negative sway, negative drift, negative step-offs  Skin: Skin is warm and dry. No rash noted. She is not diaphoretic. No erythema.  Psychiatric: She has a normal mood and affect. Her behavior is normal. Thought content normal.    ED Course  Procedures (including critical care time)  3:54 AM This provider re-assessed the patient. Reported that headache is a 3/10. Reported that she no longer feels nauseous. Patient able to tolerate fluids PO without emesis or difficulty swallowing. Denied chest pain, shortness of breath, difficulty breathing.    Results for orders placed during the hospital encounter of 07/19/13  WET PREP, GENITAL      Result Value Range   Yeast Wet Prep HPF POC NONE SEEN  NONE SEEN   Trich, Wet Prep NONE SEEN  NONE SEEN   Clue Cells Wet Prep HPF POC NONE SEEN  NONE SEEN   WBC, Wet Prep HPF POC MANY (*)  NONE SEEN  CBC WITH DIFFERENTIAL      Result Value Range   WBC 14.4 (*) 4.0 - 10.5 K/uL   RBC 4.74  3.87 - 5.11 MIL/uL   Hemoglobin 13.2  12.0 - 15.0 g/dL   HCT 16.1  09.6 - 04.5 %   MCV 83.3  78.0 - 100.0 fL   MCH 27.8  26.0 - 34.0 pg   MCHC 33.4  30.0 - 36.0 g/dL   RDW 40.9  81.1 - 91.4 %   Platelets 278  150 - 400 K/uL   Neutrophils Relative % 86 (*) 43 - 77 %   Neutro Abs 12.3 (*) 1.7 - 7.7 K/uL   Lymphocytes Relative 8 (*) 12 - 46 %   Lymphs Abs 1.2  0.7 - 4.0 K/uL   Monocytes Relative 5  3 - 12 %   Monocytes Absolute 0.7  0.1 - 1.0 K/uL   Eosinophils Relative 1  0 - 5 %   Eosinophils Absolute 0.1  0.0 - 0.7 K/uL   Basophils Relative 0  0 - 1 %   Basophils Absolute 0.0  0.0 - 0.1 K/uL  COMPREHENSIVE METABOLIC PANEL      Result Value Range   Sodium 137  137 - 147 mEq/L   Potassium 4.1  3.7 -  5.3 mEq/L   Chloride 100  96 - 112 mEq/L   CO2 24  19 - 32 mEq/L   Glucose, Bld 91  70 - 99 mg/dL   BUN 9  6 - 23 mg/dL   Creatinine, Ser 7.82  0.50 - 1.10 mg/dL   Calcium 8.6  8.4 - 95.6 mg/dL   Total Protein 7.6  6.0 - 8.3 g/dL   Albumin 3.6  3.5 - 5.2 g/dL   AST 19  0 - 37 U/L   ALT 15  0 - 35 U/L   Alkaline Phosphatase 109  39 - 117 U/L   Total Bilirubin 0.3  0.3 - 1.2 mg/dL   GFR calc non Af Amer >90  >90 mL/min   GFR calc Af Amer >90  >90 mL/min  LIPASE, BLOOD      Result Value Range   Lipase 41  11 - 59 U/L  URINALYSIS, ROUTINE W REFLEX MICROSCOPIC      Result Value Range   Color, Urine YELLOW  YELLOW   APPearance CLOUDY (*) CLEAR   Specific Gravity, Urine 1.012  1.005 - 1.030   pH 7.5  5.0 - 8.0   Glucose, UA NEGATIVE  NEGATIVE mg/dL   Hgb urine dipstick NEGATIVE  NEGATIVE   Bilirubin Urine NEGATIVE  NEGATIVE   Ketones, ur NEGATIVE  NEGATIVE mg/dL   Protein, ur NEGATIVE  NEGATIVE mg/dL   Urobilinogen, UA 1.0  0.0 - 1.0 mg/dL   Nitrite NEGATIVE  NEGATIVE   Leukocytes, UA LARGE (*) NEGATIVE  URINE MICROSCOPIC-ADD ON      Result Value Range   Squamous Epithelial / LPF MANY (*) RARE   WBC, UA 11-20  <3 WBC/hpf   RBC / HPF 0-2  <3 RBC/hpf   Bacteria, UA MANY (*) RARE  TROPONIN I      Result Value Range   Troponin I <0.30  <0.30 ng/mL  POCT PREGNANCY, URINE      Result Value Range   Preg Test, Ur NEGATIVE  NEGATIVE   Dg Chest 2 View  07/20/2013   CLINICAL DATA:  Sore throat, upper abdominal pain  EXAM: CHEST  2 VIEW  COMPARISON:  06/30/2012  FINDINGS: The heart size and mediastinal contours are within normal limits. Both lungs are clear. The visualized skeletal structures are unremarkable.  IMPRESSION: No active cardiopulmonary disease.   Electronically Signed   By: Ruel Favors M.D.   On: 07/20/2013 02:34   Ct Soft Tissue Neck Wo Contrast  07/20/2013   CLINICAL DATA:  Sore throat for 2 days. History of tonsillectomy and wisdom tooth extraction.  EXAM: CT NECK  WITHOUT CONTRAST  TECHNIQUE: Multidetector CT imaging of the neck was performed following the standard protocol without intravenous contrast.  COMPARISON:  None available for comparison at time of study interpretation.  FINDINGS: Aerodigestive tract is unremarkable. Preservation parapharyngeal fat tissue planes. Slight effacement of the left pyriform sinus is nonspecific. Mild asymmetric fullness the left nasopharynx may be accentuated by obliquity in the scanner. Normal noncontrast appearance of the major salivary glands. Somewhat limited evaluation the oral cavity due to streak artifact from dental amalgam.  Thyromegaly, isthmus is 13 mm in AP dimension. No free fluid or fluid collections within the neck. Limited assessment for lymphadenopathy on this noncontrast examination.  Right maxillary mucosal thickening. No paranasal sinus air-fluid levels. Mild broad reversed cervical lordosis without destructive bony lesions.  IMPRESSION: Slight effacement of the left pyriform sinus may be related to secretions, consider direct inspection. No definite peritonsillar abscess though, decreased sensitivity without contrast.  Thyromegaly.   Electronically Signed   By: Awilda Metro   On: 07/20/2013 03:07   Labs Review Labs Reviewed  WET PREP, GENITAL - Abnormal; Notable for the following:    WBC, Wet Prep HPF POC MANY (*)    All other components within normal limits  CBC WITH DIFFERENTIAL - Abnormal; Notable for the following:    WBC 14.4 (*)    Neutrophils Relative % 86 (*)    Neutro Abs 12.3 (*)    Lymphocytes Relative 8 (*)    All other components within normal limits  URINALYSIS, ROUTINE W REFLEX MICROSCOPIC - Abnormal; Notable for the following:    APPearance CLOUDY (*)    Leukocytes, UA LARGE (*)    All other components within normal limits  URINE MICROSCOPIC-ADD ON - Abnormal; Notable for the following:    Squamous Epithelial / LPF MANY (*)    Bacteria, UA MANY (*)    All other components within  normal limits  URINE CULTURE  GC/CHLAMYDIA PROBE AMP  COMPREHENSIVE METABOLIC PANEL  LIPASE, BLOOD  TROPONIN I  POCT PREGNANCY, URINE   Imaging Review Dg Chest 2 View  07/20/2013   CLINICAL DATA:  Sore throat, upper abdominal pain  EXAM: CHEST  2 VIEW  COMPARISON:  06/30/2012  FINDINGS: The heart size and mediastinal contours are within normal limits. Both lungs are clear. The visualized skeletal structures are unremarkable.  IMPRESSION: No active cardiopulmonary disease.   Electronically Signed   By: Ruel Favors M.D.   On: 07/20/2013 02:34   Ct Soft Tissue Neck Wo Contrast  07/20/2013   CLINICAL DATA:  Sore throat for 2 days. History of tonsillectomy and wisdom tooth extraction.  EXAM: CT NECK WITHOUT CONTRAST  TECHNIQUE: Multidetector CT imaging of the neck was performed following the standard protocol without intravenous contrast.  COMPARISON:  None available for comparison at time of study interpretation.  FINDINGS: Aerodigestive tract is unremarkable. Preservation parapharyngeal fat tissue planes. Slight effacement of the left pyriform sinus is nonspecific. Mild asymmetric fullness the left nasopharynx may be accentuated by obliquity in the scanner. Normal noncontrast appearance of the major salivary glands.  Somewhat limited evaluation the oral cavity due to streak artifact from dental amalgam.  Thyromegaly, isthmus is 13 mm in AP dimension. No free fluid or fluid collections within the neck. Limited assessment for lymphadenopathy on this noncontrast examination.  Right maxillary mucosal thickening. No paranasal sinus air-fluid levels. Mild broad reversed cervical lordosis without destructive bony lesions.  IMPRESSION: Slight effacement of the left pyriform sinus may be related to secretions, consider direct inspection. No definite peritonsillar abscess though, decreased sensitivity without contrast.  Thyromegaly.   Electronically Signed   By: Awilda Metro   On: 07/20/2013 03:07    EKG  Interpretation   None      Date: 07/20/2013  Rate: 93  Rhythm: normal sinus rhythm  QRS Axis: normal  Intervals: normal  ST/T Wave abnormalities: normal  Conduction Disutrbances:none  Narrative Interpretation:   Old EKG Reviewed: unchanged EKG analyzed and reviewed by this provider and attending physician.     MDM   1. UTI (urinary tract infection)   2. Sore throat   3. Viral syndrome     Medications  sodium chloride 0.9 % bolus 1,000 mL (0 mLs Intravenous Stopped 07/20/13 0354)  metoCLOPramide (REGLAN) injection 10 mg (10 mg Intravenous Given 07/20/13 0242)  ketorolac (TORADOL) 30 MG/ML injection 30 mg (30 mg Intravenous Given 07/20/13 0243)  cefTRIAXone (ROCEPHIN) 1 g in dextrose 5 % 50 mL IVPB (0 g Intravenous Stopped 07/20/13 0415)   Filed Vitals:   07/19/13 1943 07/20/13 0004 07/20/13 0353  BP: 120/78 135/72 128/65  Pulse: 91 109 98  Temp: 98.7 F (37.1 C) 97.7 F (36.5 C) 99.1 F (37.3 C)  TempSrc: Oral  Oral  Resp: 16 16 18   Height: 5\' 6"  (1.676 m)    Weight: 272 lb (123.378 kg)    SpO2: 100% 100% 100%    Patient presenting to the ED with multiples complaints. Patient presenting with cough that is productive with a whitish phlegm. Patient reported migraine that started today - reported that the symptoms are like her normal migraines - negative new symptoms. Reported abdominal pain localized to the lower abdomen described as an aching sensation without radiation. Patient reported that she has been experiencing back pain described as an dull ache that has progressively gotten worse. Reported sore throat with difficulty swallowing food and fluids. Reported chest pain described as a sharp pain that is localized to the center of her chest with radiation to her right side of her chest - occurs with cough. Alert and oriented. GCS 15. Heart rate and rhythm normal. Lungs clear to auscultation to upper and lower lobes. Pain reproducible upon palpation to the chest wall - mostly  muscular in nature. Radial and DP pulses 2+ bilaterally. Obese. BS normoactive in all 4 quadrants with discomfort upon palpation to the lower abdominal region - LLQ, RLQ, and suprapubic. Positive CVA tenderness noted to the left side. Negative nystagmus. Negative nuchal rigidity. Discomfort upon palpation to the left side of the neck - appears to be muscular in nature. Uvula midline, symmetrical elevation. Pelvic exam noted thick white vaginal discharge with negative CMT and bilateral adnexal tenderness. Cranial nerves grossly intact. Negative ataxia noted with motion. Strength intact to upper and lower extremities bilaterally without difficulty noted. Sensation intact. Negative focal neurological deficits noted. EKG noted normal sinus rhythm with heart rate of 93 bpm. Troponin negative elevation. CBC noted elevated WBC of 14.4 with mild elevation of neutrophils of 12.3. CMP negative findings noted. Lipase negative elevation. UA large leukocytes with WBC  of 11-20, many bacteria noted in sample - positive pyuria. Chest xray negative acute cardiopulmonary disease noted. Negative findings for pneumonia. CT soft tissue neck slight effacement of the left peripheral sinus may be related to secretions, consider direct inspection. CT negative for peritonsillar abscess. Wet prep negative for Trich - many WBC noted. Urine pregnancy negative. Doubt ICH. Doubt SAH. Doubt meningitis. Doubt ACS. Doubt PE - PERC 0. Negative findings for pneumonia. Negative findings for peritonsillar abscess. Doubt TOA. Doubt ovarian torsion. Doubt streptococcal pharyngitis - patient has tonsils removed. Suspicion to be muscular pain - localized to the neck and chest - since pain present with palpation and motion. Cannot rule out possible viral syndrome. Patient presenting to the ED with UTI - mild elevation of WBC with negative leukocytosis noted, negative fever, negative hypotension, negative tachycardia noted - patient stable. Patient not  septic at this moment. IV antibiotics given in ED setting. Patient discharged. Discharged patient with naproxen and antibiotics for UTI. Referred patient to ENT and OBGYN. Discussed with patient to rest and stay hydrated - discussed with patient to drink plenty of water. Discussed with patient to closely monitor symptoms and if symptoms are to worsen or change to report back to the ED - strict return instructions given.  Patient agreed to plan of care, understood, all questions answered.  Raymon MuttonMarissa Winta Barcelo, PA-C 07/20/13 1146

## 2013-07-21 NOTE — ED Provider Notes (Signed)
Medical screening examination/treatment/procedure(s) were performed by non-physician practitioner and as supervising physician I was immediately available for consultation/collaboration.  EKG Interpretation   None         William Shenise Wolgamott, MD 07/21/13 0251 

## 2013-08-02 ENCOUNTER — Emergency Department (HOSPITAL_COMMUNITY)
Admission: EM | Admit: 2013-08-02 | Discharge: 2013-08-03 | Disposition: A | Discharge status: Home / Self Care | Payer: Medicaid Other | Attending: Emergency Medicine | Admitting: Emergency Medicine

## 2013-08-02 ENCOUNTER — Emergency Department (HOSPITAL_COMMUNITY): Payer: Medicaid Other

## 2013-08-02 ENCOUNTER — Encounter (HOSPITAL_COMMUNITY): Payer: Self-pay | Admitting: Emergency Medicine

## 2013-08-02 DIAGNOSIS — Z3202 Encounter for pregnancy test, result negative: Secondary | ICD-10-CM | POA: Insufficient documentation

## 2013-08-02 DIAGNOSIS — IMO0002 Reserved for concepts with insufficient information to code with codable children: Secondary | ICD-10-CM | POA: Insufficient documentation

## 2013-08-02 DIAGNOSIS — Y939 Activity, unspecified: Secondary | ICD-10-CM | POA: Insufficient documentation

## 2013-08-02 DIAGNOSIS — Z87440 Personal history of urinary (tract) infections: Secondary | ICD-10-CM | POA: Insufficient documentation

## 2013-08-02 DIAGNOSIS — Y929 Unspecified place or not applicable: Secondary | ICD-10-CM | POA: Insufficient documentation

## 2013-08-02 DIAGNOSIS — J45901 Unspecified asthma with (acute) exacerbation: Secondary | ICD-10-CM | POA: Insufficient documentation

## 2013-08-02 DIAGNOSIS — T148XXA Other injury of unspecified body region, initial encounter: Secondary | ICD-10-CM

## 2013-08-02 DIAGNOSIS — X58XXXA Exposure to other specified factors, initial encounter: Secondary | ICD-10-CM | POA: Insufficient documentation

## 2013-08-02 DIAGNOSIS — J40 Bronchitis, not specified as acute or chronic: Secondary | ICD-10-CM

## 2013-08-02 HISTORY — DX: Unspecified asthma, uncomplicated: J45.909

## 2013-08-02 LAB — URINALYSIS, ROUTINE W REFLEX MICROSCOPIC
BILIRUBIN URINE: NEGATIVE
Glucose, UA: NEGATIVE mg/dL
Hgb urine dipstick: NEGATIVE
KETONES UR: NEGATIVE mg/dL
NITRITE: NEGATIVE
PROTEIN: NEGATIVE mg/dL
Specific Gravity, Urine: 1.028 (ref 1.005–1.030)
Urobilinogen, UA: 1 mg/dL (ref 0.0–1.0)
pH: 6 (ref 5.0–8.0)

## 2013-08-02 LAB — URINE MICROSCOPIC-ADD ON

## 2013-08-02 LAB — POCT PREGNANCY, URINE: Preg Test, Ur: NEGATIVE

## 2013-08-02 MED ORDER — ALBUTEROL SULFATE HFA 108 (90 BASE) MCG/ACT IN AERS
2.0000 | INHALATION_SPRAY | RESPIRATORY_TRACT | Status: DC | PRN
  Administered 2013-08-02 (×2): 2 via RESPIRATORY_TRACT
  Filled 2013-08-02: qty 6.7

## 2013-08-02 MED ORDER — HYDROCOD POLST-CHLORPHEN POLST 10-8 MG/5ML PO LQCR: 5.0000 mL | Freq: Two times a day (BID) | ORAL | Status: DC | PRN

## 2013-08-02 MED ORDER — METAXALONE 800 MG PO TABS: 800.0000 mg | ORAL_TABLET | Freq: Three times a day (TID) | ORAL | Status: DC

## 2013-08-02 MED ORDER — AEROCHAMBER PLUS FLO-VU LARGE MISC
1.0000 | Freq: Once | Status: AC
  Administered 2013-08-02: 1
  Filled 2013-08-02 (×2): qty 1

## 2013-08-02 NOTE — ED Provider Notes (Signed)
CSN: [id number]     Arrival date & time 08/02/13  2035 History  This chart was scribed for non-physician practitioner Philis Kendall working with Eber Hong, MD by Joaquin Music, ED Scribe. This patient was seen in room WTR6/WTR6 and the patient's care was started at 11:24 PM .   Chief Complaint  Patient presents with  . URI   Patient is a 28 y.o. female presenting with URI. The history is provided by the patient. No language interpreter was used.  URI Presenting symptoms: cough   Presenting symptoms: no congestion   Severity:  Moderate Onset quality:  Gradual Timing:  Intermittent Progression:  Worsening Chronicity:  Recurrent Relieved by:  Nothing Worsened by:  Movement Ineffective treatments:  Decongestant and OTC medications Associated symptoms: no neck pain, no sinus pain and no wheezing    HPI Comments: Melinda Dorsey is a 28 y.o. female who presents to the Emergency Department complaining of ongoing dry non-productive cough and congestion that began 3 weeks ago. Pt states she was seen 07/20/2013 in the ED and diagnosed . She states she has been SOB typically when talking.Pt also complains of L sided kidney pain. She states she recently had a UTI. Pt states she has been taking Sudafed with little relief.   Past Medical History  Diagnosis Date  . Asthma    Past Surgical History  Procedure Laterality Date  . Tonsillectomy    . Wisdom tooth extraction     No family history on file. History  Substance Use Topics  . Smoking status: Never Smoker   . Smokeless tobacco: Never Used  . Alcohol Use: No   OB History   Grav Para Term Preterm Abortions TAB SAB Ect Mult Living                 Review of Systems  HENT: Negative for congestion.   Respiratory: Positive for cough and shortness of breath. Negative for wheezing.   Musculoskeletal: Positive for back pain. Negative for neck pain.    Allergies  Ivp dye  Home Medications   Current Outpatient Rx  Name   Route  Sig  Dispense  Refill  . ibuprofen (ADVIL,MOTRIN) 200 MG tablet   Oral   Take 400 mg by mouth every 6 (six) hours as needed for headache.         . naproxen (NAPROSYN) 375 MG tablet   Oral   Take 1 tablet (375 mg total) by mouth 2 (two) times daily.   20 tablet   0   . pseudoephedrine (SUDAFED) 30 MG tablet   Oral   Take 30 mg by mouth every 4 (four) hours as needed for congestion.         . sulfamethoxazole-trimethoprim (BACTRIM DS) 800-160 MG per tablet   Oral   Take 1 tablet by mouth 2 (two) times daily. For 14 days.          BP 128/65  Pulse 83  Temp(Src) 98.5 F (36.9 C) (Oral)  Resp 20  Ht 5\' 6"  (1.676 m)  Wt 257 lb (116.574 kg)  BMI 41.50 kg/m2  SpO2 100%  LMP 05/19/2013  Physical Exam  Nursing note and vitals reviewed. Constitutional: She is oriented to person, place, and time. She appears well-developed and well-nourished. No distress.  HENT:  Head: Normocephalic and atraumatic.  Eyes: EOM are normal.  Neck: Normal range of motion. Neck supple.  Cardiovascular: Normal rate, regular rhythm and normal heart sounds.   Pulmonary/Chest: Effort normal and breath sounds  normal. No respiratory distress. She has no wheezes.  Patient becomes breathless with exertion, or post coughing  Musculoskeletal: Normal range of motion.  Neurological: She is alert and oriented to person, place, and time.  Skin: Skin is warm and dry.  Psychiatric: She has a normal mood and affect. Her behavior is normal.    ED Course  Procedures  DIAGNOSTIC STUDIES: Oxygen Saturation is 100% on RA, normal by my interpretation.    COORDINATION OF CARE: 11:26 PM-Discussed treatment plan which includes discharge pt with a muscle relaxer and cough medication. Will inform nurse to bring inhaler for patient while in ED. Pt agreed to plan.   Labs Review Labs Reviewed  URINALYSIS, ROUTINE W REFLEX MICROSCOPIC  POCT PREGNANCY, URINE   Imaging Review Dg Chest 2 View  08/02/2013    CLINICAL DATA:  Chest tightness, cough and shortness of breath.  EXAM: CHEST  2 VIEW  COMPARISON:  Chest radiograph July 20, 1998 and.  FINDINGS: Cardiomediastinal silhouette is unremarkable. The lungs are clear without pleural effusions or focal consolidations. Trachea projects midline and there is no pneumothorax. Soft tissue planes and included osseous structures are non-suspicious. Very mild lower thoracic dextroscoliosis could be positional.  IMPRESSION: No acute cardiopulmonary process.   Electronically Signed   By: Awilda Metroourtnay  Bloomer   On: 08/02/2013 22:58    EKG Interpretation   None      MDM   Final diagnoses:  None     Is reviewed negative for pneumonia.  Patient has, bronchitis.  She'll be treated with an inhaler to help her symptoms, as well, as Tussionex to help control her coughing to allow this  I personally performed the services described in this documentation, which was scribed in my presence. The recorded information has been reviewed and is accurate.    Arman FilterGail K Maya Scholer, NP 08/02/13 2352

## 2013-08-02 NOTE — ED Notes (Signed)
Pt states that she was seen on 1/27 for cough; pt states that the cough has persisted and now has congestion as well; pt states that the cough remains a dry non-productive cough; pt states that she has "coughing spells" and feels short of breath after these episodes; pt states that she also has pain to kidney area and recently has had a UTI

## 2013-08-02 NOTE — Discharge Instructions (Signed)
Bronchitis Bronchitis is inflammation of the airways that extend from the windpipe into the lungs (bronchi). The inflammation often causes mucus to develop, which leads to a cough. If the inflammation becomes severe, it may cause shortness of breath. CAUSES  Bronchitis may be caused by:   Viral infections.   Bacteria.   Cigarette smoke.   Allergens, pollutants, and other irritants.  SIGNS AND SYMPTOMS  The most common symptom of bronchitis is a frequent cough that produces mucus. Other symptoms include:  Fever.   Body aches.   Chest congestion.   Chills.   Shortness of breath.   Sore throat.  DIAGNOSIS  Bronchitis is usually diagnosed through a medical history and physical exam. Tests, such as chest X-rays, are sometimes done to rule out other conditions.  TREATMENT  You may need to avoid contact with whatever caused the problem (smoking, for example). Medicines are sometimes needed. These may include:  Antibiotics. These may be prescribed if the condition is caused by bacteria.  Cough suppressants. These may be prescribed for relief of cough symptoms.   Inhaled medicines. These may be prescribed to help open your airways and make it easier for you to breathe.   Steroid medicines. These may be prescribed for those with recurrent (chronic) bronchitis. HOME CARE INSTRUCTIONS  Get plenty of rest.   Drink enough fluids to keep your urine clear or pale yellow (unless you have a medical condition that requires fluid restriction). Increasing fluids may help thin your secretions and will prevent dehydration.   Only take over-the-counter or prescription medicines as directed by your health care provider.  Only take antibiotics as directed. Make sure you finish them even if you start to feel better.  Avoid secondhand smoke, irritating chemicals, and strong fumes. These will make bronchitis worse. If you are a smoker, quit smoking. Consider using nicotine gum or  skin patches to help control withdrawal symptoms. Quitting smoking will help your lungs heal faster.   Put a cool-mist humidifier in your bedroom at night to moisten the air. This may help loosen mucus. Change the water in the humidifier daily. You can also run the hot water in your shower and sit in the bathroom with the door closed for 5 10 minutes.   Follow up with your health care provider as directed.   Wash your hands frequently to avoid catching bronchitis again or spreading an infection to others.  SEEK MEDICAL CARE IF: Your symptoms do not improve after 1 week of treatment.  SEEK IMMEDIATE MEDICAL CARE IF:  Your fever increases.  You have chills.   You have chest pain.   You have worsening shortness of breath.   You have bloody sputum.  You faint.  You have lightheadedness.  You have a severe headache.   You vomit repeatedly. MAKE SURE YOU:   Understand these instructions.  Will watch your condition.  Will get help right away if you are not doing well or get worse. Document Released: 06/10/2005 Document Revised: 03/31/2013 Document Reviewed: 02/02/2013 [facility] CenterExitCare Patient Information 2014 [place], [place]. Your chest x-ray did not show any pneumonia.  Please use the provided inhaler as follows 2 puffs every 4-6 hours while awake for the next 2 days, then as needed.  Thereafter.  Please take the Tussionex, as needed.  For coughing, and the Skelaxin, a regular basis for the next 2-3, days for the muscle strain and, you occurred through the classes of frequent forceful coughing

## 2013-08-03 NOTE — ED Provider Notes (Signed)
Medical screening examination/treatment/procedure(s) were performed by non-physician practitioner and as supervising physician I was immediately available for consultation/collaboration.    Ariani Seier D Glynn Yepes, MD 08/03/13 0652 

## 2014-01-28 ENCOUNTER — Encounter (HOSPITAL_COMMUNITY): Payer: Self-pay | Admitting: Emergency Medicine

## 2014-01-28 ENCOUNTER — Emergency Department (HOSPITAL_COMMUNITY)
Admission: EM | Admit: 2014-01-28 | Discharge: 2014-01-28 | Disposition: A | Discharge status: Home / Self Care | Payer: Medicaid Other | Attending: Emergency Medicine | Admitting: Emergency Medicine

## 2014-01-28 DIAGNOSIS — M79609 Pain in unspecified limb: Secondary | ICD-10-CM | POA: Insufficient documentation

## 2014-01-28 DIAGNOSIS — J45909 Unspecified asthma, uncomplicated: Secondary | ICD-10-CM | POA: Insufficient documentation

## 2014-01-28 DIAGNOSIS — Z791 Long term (current) use of non-steroidal anti-inflammatories (NSAID): Secondary | ICD-10-CM | POA: Insufficient documentation

## 2014-01-28 DIAGNOSIS — M722 Plantar fascial fibromatosis: Secondary | ICD-10-CM | POA: Diagnosis not present

## 2014-01-28 MED ORDER — NAPROXEN 500 MG PO TABS: 500.0000 mg | ORAL_TABLET | Freq: Two times a day (BID) | ORAL | Status: AC

## 2014-01-28 NOTE — ED Notes (Signed)
Pt reports she started working at [employer] and on her feet for long hours starting in April. Reports she started having foot pain x1 month. Pt reports for last 4 years after having her last child her feet she has had intermittent feet swelling that can extend up legs. At present pain in feet 8/10 at rest. 10/10 when ambulating. Pt is ambulatory. Denies SOB.

## 2014-01-28 NOTE — ED Provider Notes (Signed)
CSN: [id number]     Arrival date & time 01/28/14  [id number] History   First MD Initiated Contact with Patient 01/28/14 [phone]     Chief Complaint  Patient presents with  . 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 No injuries.  Bought shoes from the company store that were recommended to her. No different complains of pain in both arches  Past Medical History  Diagnosis Date  . Asthma    Past Surgical History  Procedure Laterality Date  . Tonsillectomy    . Wisdom tooth extraction     History reviewed. No pertinent family history. History  Substance Use Topics  . Smoking status: Never Smoker   . Smokeless tobacco: Never Used  . Alcohol Use: No   OB History   Grav Para Term Preterm Abortions TAB SAB Ect Mult Living                 Review of Systems  Musculoskeletal:       Bilateral foot pain      Allergies  Ivp dye and Other  Home Medications   Prior to Admission medications   Medication Sig Start Date End Date Taking? Authorizing Provider  ibuprofen (ADVIL,MOTRIN) 200 MG tablet Take 600-800 mg by mouth every 6 (six) hours as needed for headache or moderate pain.    Yes Historical Provider, MD  naproxen (NAPROSYN) 500 MG tablet Take 1 tablet (500 mg total) by mouth 2 (two) times daily. 01/28/14   Rolland PorterMark Mariah Harn, MD   BP 126/69  Pulse 83  Temp(Src) 98.1 F (36.7 C)  Resp 16  SpO2 98% Physical Exam  Musculoskeletal:  High arches.  Otherwise normal foot anatomy.  TTP B Arches, and to plantar calcaneous tip.    ED Course  Procedures (including critical care time) Labs Review Labs Reviewed - No data to display  Imaging Review No results found.   EKG Interpretation None      MDM   Final diagnoses:  Plantar fasciitis    Recommend arch supports with heel lift.  PCP F/U     Rolland PorterMark Angelin Cutrone, MD 01/28/14 1540

## 2014-01-28 NOTE — Discharge Instructions (Signed)
Try arch supports, and/or a heel lift in your work shoes.  Plantar Fasciitis Plantar fasciitis is a common condition that causes foot pain. It is soreness (inflammation) of the band of tough fibrous tissue on the bottom of the foot that runs from the heel bone (calcaneus) to the ball of the foot. The cause of this soreness may be from excessive standing, poor fitting shoes, running on hard surfaces, being overweight, having an abnormal walk, or overuse (this is common in runners) of the painful foot or feet. It is also common in aerobic exercise dancers and ballet dancers. SYMPTOMS  Most people with plantar fasciitis complain of:  Severe pain in the morning on the bottom of their foot especially when taking the first steps out of bed. This pain recedes after a few minutes of walking.  Severe pain is experienced also during walking following a long period of inactivity.  Pain is worse when walking barefoot or up stairs DIAGNOSIS   Your caregiver will diagnose this condition by examining and feeling your foot.  Special tests such as X-rays of your foot, are usually not needed. PREVENTION   Consult a sports medicine professional before beginning a new exercise program.  Walking programs offer a good workout. With walking there is a lower chance of overuse injuries common to runners. There is less impact and less jarring of the joints.  Begin all new exercise programs slowly. If problems or pain develop, decrease the amount of time or distance until you are at a comfortable level.  Wear good shoes and replace them regularly.  Stretch your foot and the heel cords at the back of the ankle (Achilles tendon) both before and after exercise.  Run or exercise on even surfaces that are not hard. For example, asphalt is better than pavement.  Do not run barefoot on hard surfaces.  If using a treadmill, vary the incline.  Do not continue to workout if you have foot or joint problems. Seek  professional help if they do not improve. HOME CARE INSTRUCTIONS   Avoid activities that cause you pain until you recover.  Use ice or cold packs on the problem or painful areas after working out.  Only take over-the-counter or prescription medicines for pain, discomfort, or fever as directed by your caregiver.  Soft shoe inserts or athletic shoes with air or gel sole cushions may be helpful.  If problems continue or become more severe, consult a sports medicine caregiver or your own health care provider. Cortisone is a potent anti-inflammatory medication that may be injected into the painful area. You can discuss this treatment with your caregiver. MAKE SURE YOU:   Understand these instructions.  Will watch your condition.  Will get help right away if you are not doing well or get worse. Document Released: 03/05/2001 Document Revised: 09/02/2011 Document Reviewed: 05/04/2008 [facility] CentersExitCare Patient Information 2015 [place]ExitCare, [place]LL[place]. This information is not intended to replace advice given to you by your health care provider. Make sure you discuss any questions you have with your health care provider.

## 2014-03-20 IMAGING — CT CT NECK W/O CM
1 of 2 series · 10 of 14 positions shown, 13 images · IV contrast (OMNIPAQUE 300)
Comparison: None available for comparison at time of study
interpretation.

CLINICAL DATA: Sore throat for 2 days. History of tonsillectomy and
wisdom tooth extraction.

EXAM:
CT NECK WITHOUT CONTRAST
TECHNIQUE: Multidetector CT imaging of the neck was performed following the
standard protocol without intravenous contrast.

[Series 2: neck with st · axial · 0.37mm/px · z∈[+1036,+1184]mm · 10 of 92 slices shown, 13 images]
[im 9/92  soft-tissue]
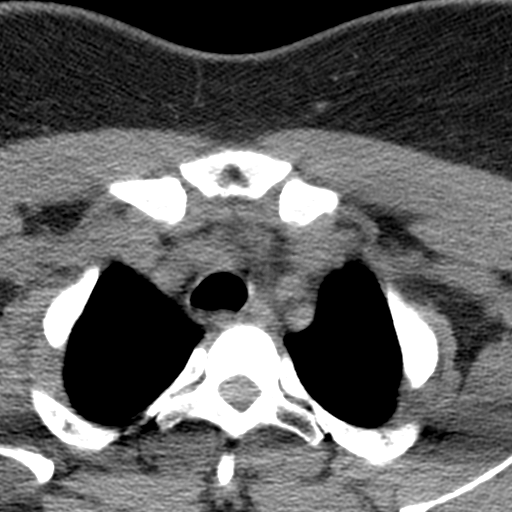
[im 9/92  bone]
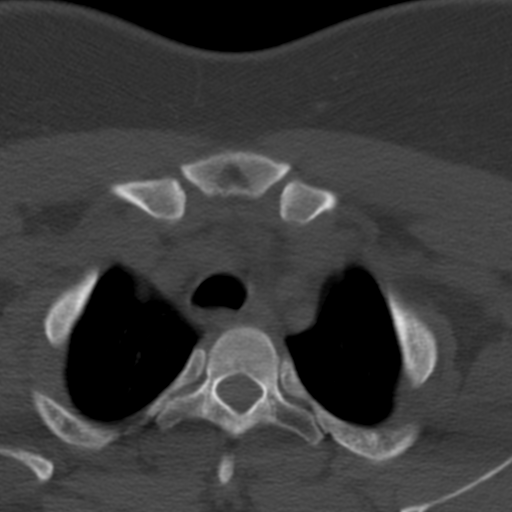
[im 17/92  bone]
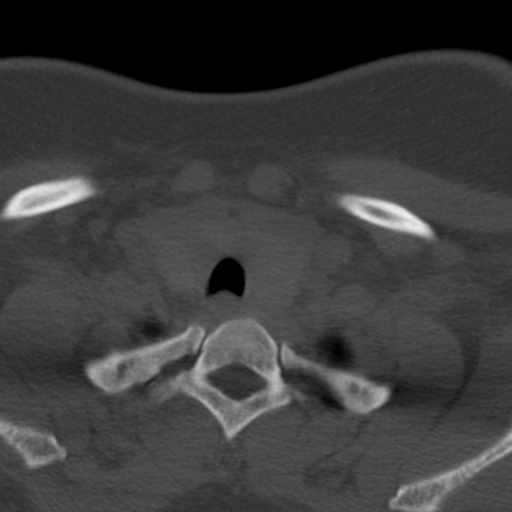
[im 25/92  bone]
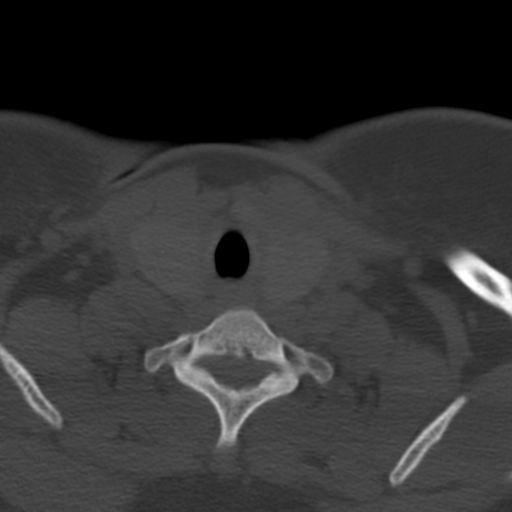
[im 34/92  bone]
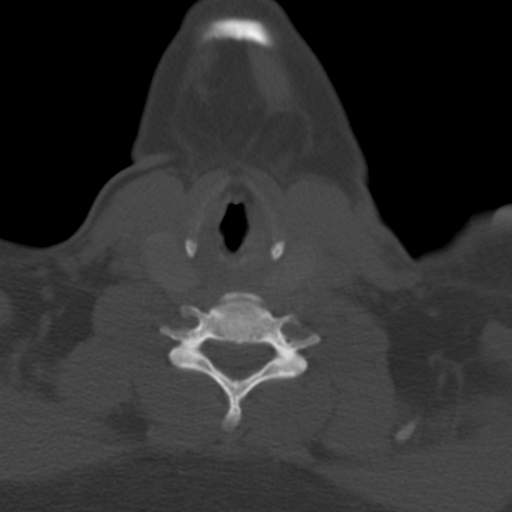
[im 42/92  soft-tissue]
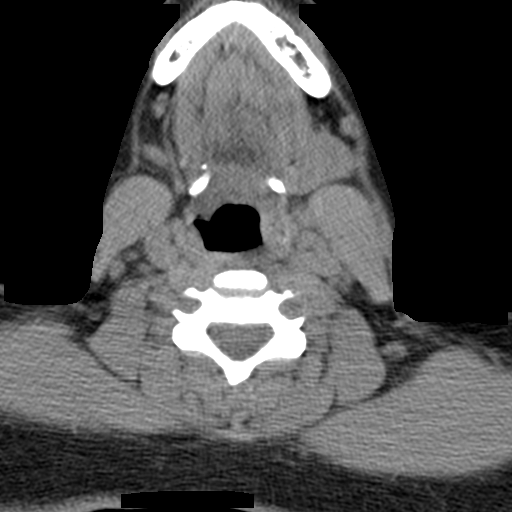
[im 42/92  bone]
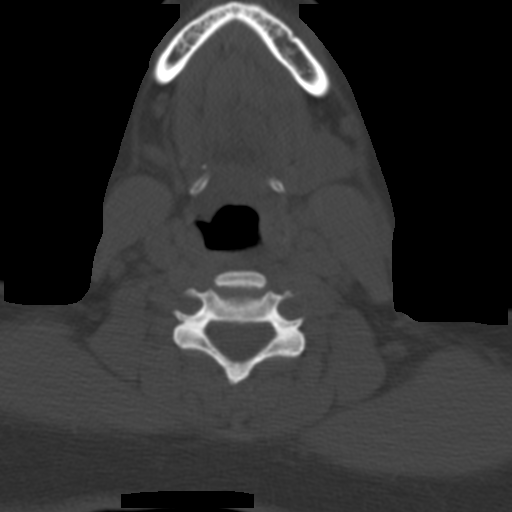
[im 50/92  bone]
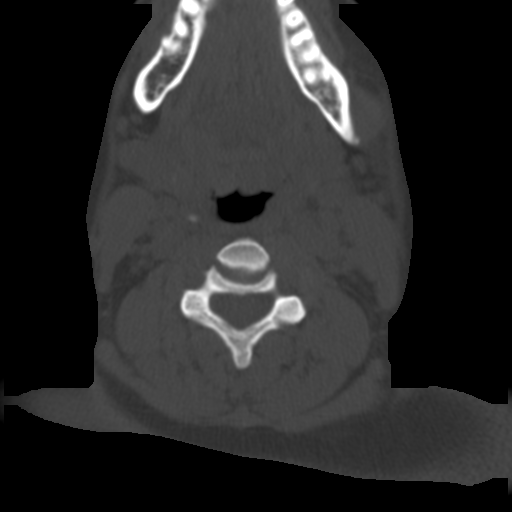
[im 58/92  bone]
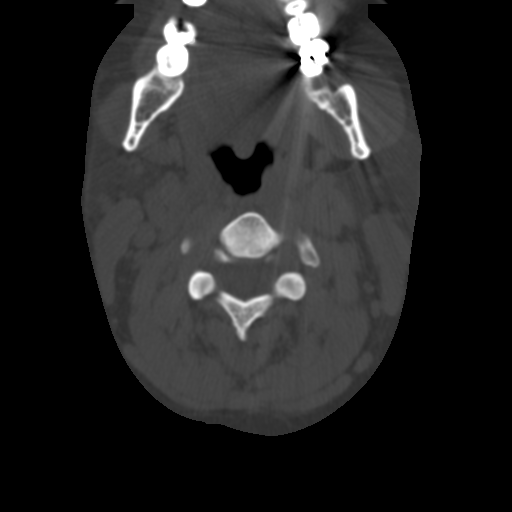
[im 67/92  bone]
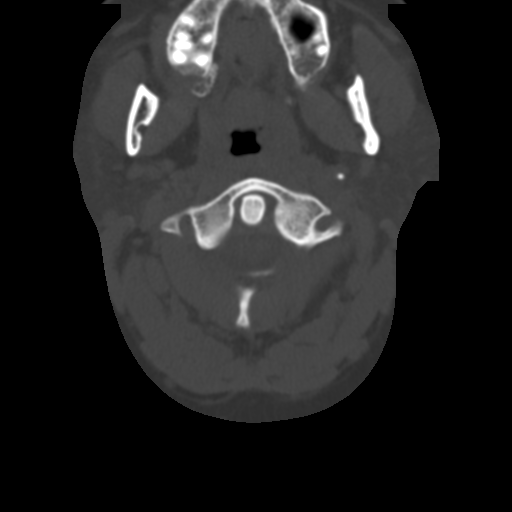
[im 75/92  soft-tissue]
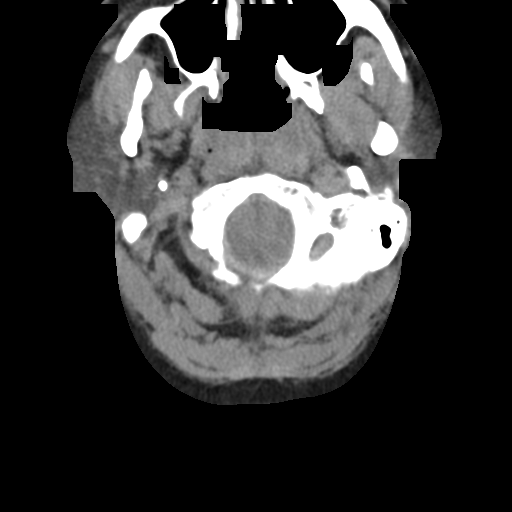
[im 75/92  bone]
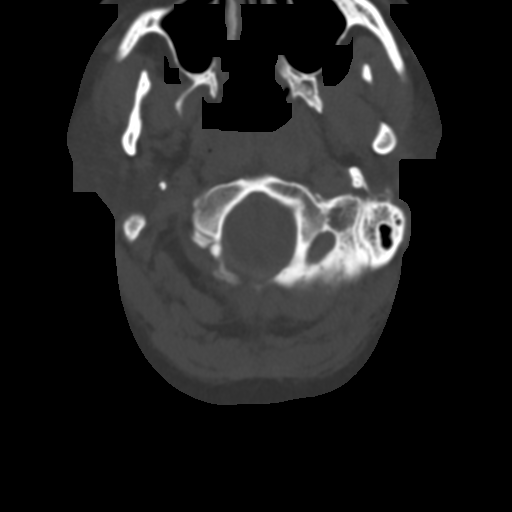
[im 83/92  bone]
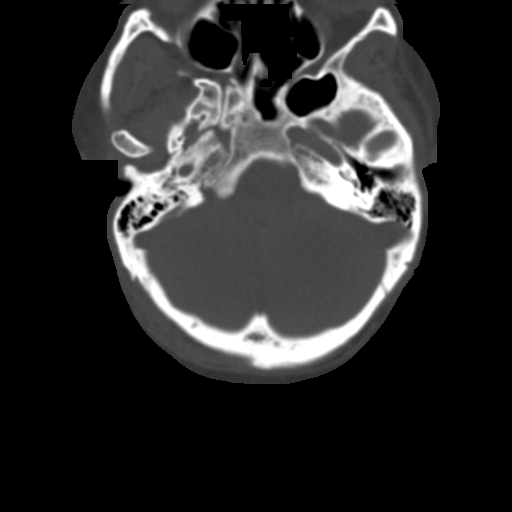

[10 of 14 positions shown; findings below may reference images not displayed]

FINDINGS: Aerodigestive tract is unremarkable. Preservation parapharyngeal fat
tissue planes. Slight effacement of the left pyriform sinus is
nonspecific. Mild asymmetric fullness the left nasopharynx may be
accentuated by obliquity in the scanner. Normal noncontrast
appearance of the major salivary glands. Somewhat limited evaluation
the oral cavity due to streak artifact from dental amalgam.

Thyromegaly, isthmus is 13 mm in AP dimension. No free fluid or
fluid collections within the neck. Limited assessment for
lymphadenopathy on this noncontrast examination.

Right maxillary mucosal thickening. No paranasal sinus air-fluid
levels. Mild broad reversed cervical lordosis without destructive
bony lesions.
IMPRESSION: Slight effacement of the left pyriform sinus may be related to
secretions, consider direct inspection. No definite peritonsillar
abscess though, decreased sensitivity without contrast.

Thyromegaly.

  By: Willy Modesto

## 2014-03-20 IMAGING — CR DG CHEST 2V
2 series · 2 of 2 positions shown · non-contrast
Comparison: 06/30/2012

CLINICAL DATA: Sore throat, upper abdominal pain

EXAM:
CHEST  2 VIEW

[w chest pa]
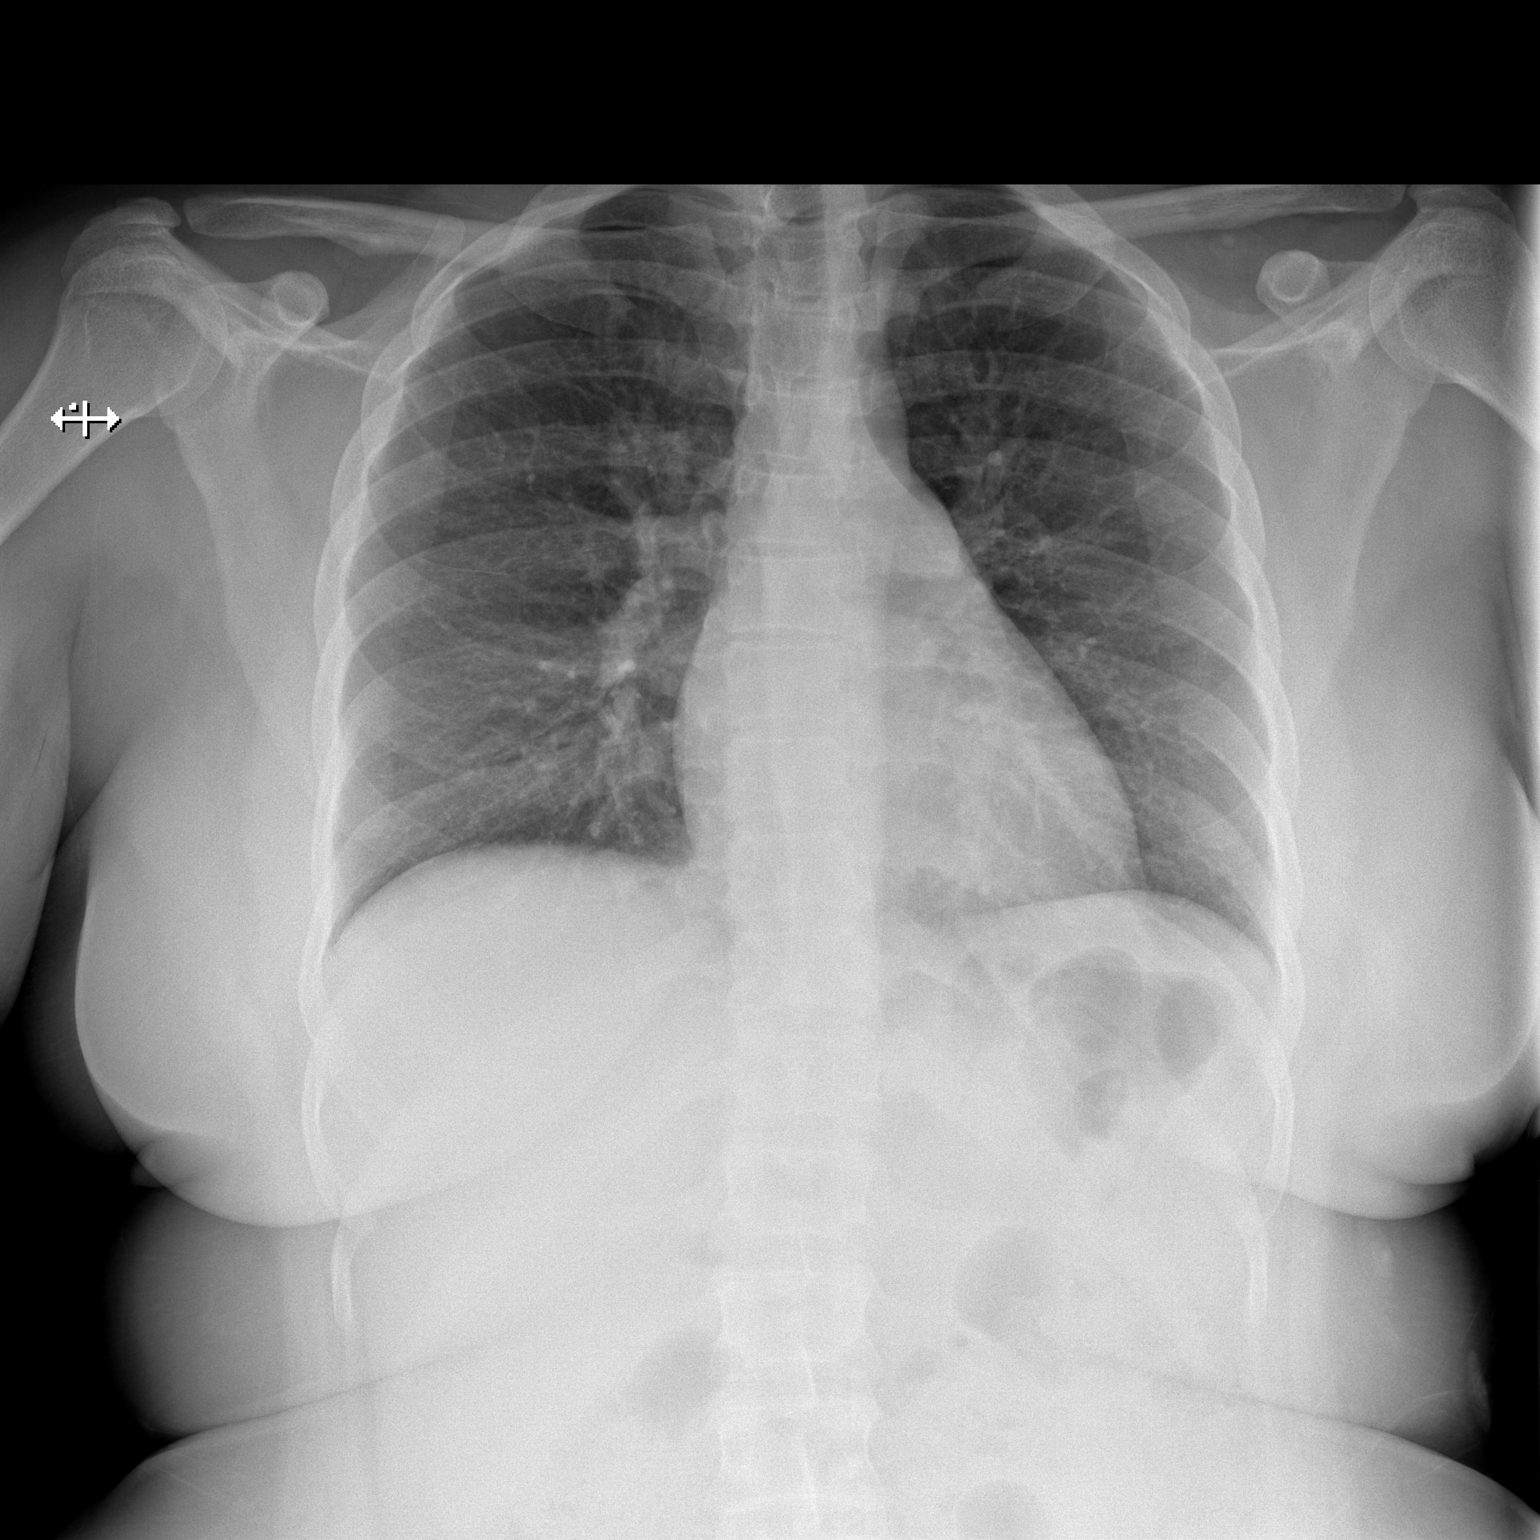

[w chest lat]
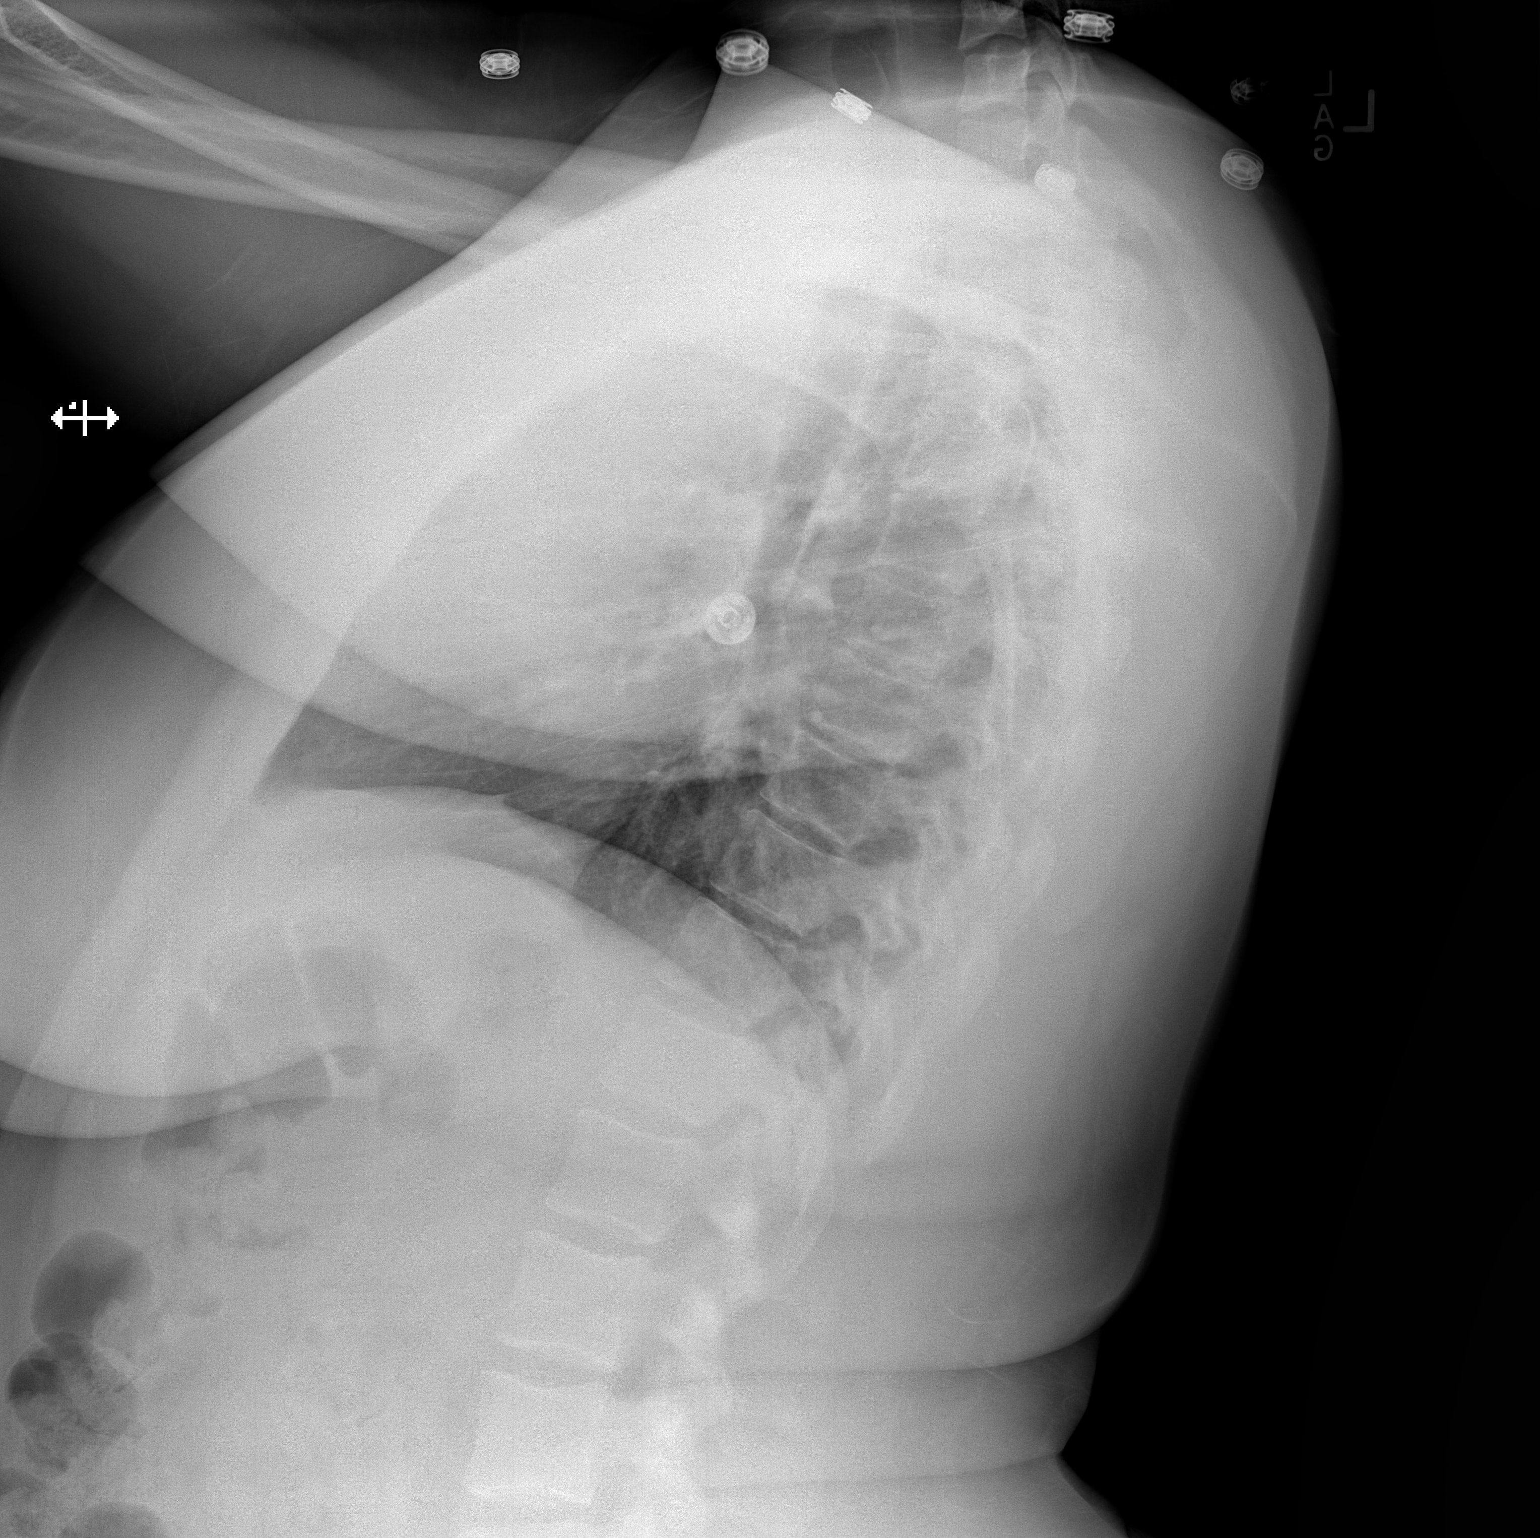

[2 of 2 positions shown; findings below may reference images not displayed]

FINDINGS: The heart size and mediastinal contours are within normal limits.
Both lungs are clear. The visualized skeletal structures are
unremarkable.
IMPRESSION: No active cardiopulmonary disease.
